# Patient Record
Sex: Male | Born: 2000 | Race: White | Hispanic: No | Marital: Single | Smoking: Never smoker
Health system: Southern US, Community
[De-identification: ages and names within clinical notes are randomized; demographics above are authoritative.]

## PROBLEM LIST (undated history)

## (undated) DIAGNOSIS — F909 Attention-deficit hyperactivity disorder, unspecified type: Secondary | ICD-10-CM

## (undated) HISTORY — PX: NO PAST SURGERIES: SHX2092

---
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
## 2020-06-21 ENCOUNTER — Other Ambulatory Visit: Payer: Self-pay

## 2020-06-21 ENCOUNTER — Encounter: Payer: Self-pay | Admitting: Emergency Medicine

## 2020-06-21 ENCOUNTER — Ambulatory Visit
Admission: EM | Admit: 2020-06-21 | Discharge: 2020-06-21 | Disposition: A | Discharge status: Home / Self Care | Payer: Managed Care, Other (non HMO) | Attending: Emergency Medicine | Admitting: Emergency Medicine

## 2020-06-21 DIAGNOSIS — Z20822 Contact with and (suspected) exposure to covid-19: Secondary | ICD-10-CM | POA: Insufficient documentation

## 2020-06-21 DIAGNOSIS — R059 Cough, unspecified: Secondary | ICD-10-CM | POA: Insufficient documentation

## 2020-06-21 DIAGNOSIS — R112 Nausea with vomiting, unspecified: Secondary | ICD-10-CM | POA: Diagnosis not present

## 2020-06-21 DIAGNOSIS — R197 Diarrhea, unspecified: Secondary | ICD-10-CM | POA: Insufficient documentation

## 2020-06-21 DIAGNOSIS — K30 Functional dyspepsia: Secondary | ICD-10-CM | POA: Diagnosis not present

## 2020-06-21 LAB — RESP PANEL BY RT-PCR (FLU A&B, COVID) ARPGX2
Influenza A by PCR: NEGATIVE
Influenza B by PCR: NEGATIVE
SARS Coronavirus 2 by RT PCR: NEGATIVE

## 2020-06-21 MED ORDER — ONDANSETRON 4 MG PO TBDP
4.0000 mg | ORAL_TABLET | Freq: Once | ORAL | Status: AC
  Administered 2020-06-21: 4 mg via ORAL

## 2020-06-21 MED ORDER — ONDANSETRON 4 MG PO TBDP: 4.0000 mg | ORAL_TABLET | Freq: Three times a day (TID) | ORAL | 0 refills | Status: DC | PRN

## 2020-06-21 NOTE — ED Triage Notes (Signed)
Pt c/o diarrhea, vomiting and cough. Started about 5 days ago. Denies fever.

## 2020-06-21 NOTE — ED Provider Notes (Signed)
[facility]    CSN: [id number] Arrival date & time: 06/21/20  7517      History   Chief Complaint Chief Complaint  Patient presents with  . Emesis  . Diarrhea    HPI Mike Young is a 19 y.o. male.   Mike Young presents with complaints of symptoms which started 12/8. Stomach upset followed by diarrhea. Has had vomiting and diarrhea since. He feels he has somewhat improved. Yesterday vomited once, and had 2-3 loose stools. Has not had either yet today. Still with mild nausea. Non bloody non bilious emesis and stool. Some dry cough. No sore throat. No fevers. Normal urination. Still taking in food and liquids. No known ill contacts. No previous abdominal surgeries. Has been vaccinated for covid-19. Has been taking mucinex for cough which has helped some.    ROS per HPI, negative if not otherwise mentioned.      Past Medical History:  Diagnosis Date  . ADHD (attention deficit hyperactivity disorder)     There are no problems to display for this patient.   Past Surgical History:  Procedure Laterality Date  . NO PAST SURGERIES         Home Medications    Prior to Admission medications   Medication Sig Start Date End Date Taking? Authorizing Provider  amphetamine-dextroamphetamine (ADDERALL XR) 25 MG 24 hr capsule Take by mouth. 07/01/19  Yes [provider]  ibuprofen (ADVIL,MOTRIN) 600 MG tablet Take 1 tablet (600 mg total) by mouth every 6 (six) hours as needed for moderate pain. 12/12/14  Yes Evon Slack, PA-C  ondansetron (ZOFRAN-ODT) 4 MG disintegrating tablet Take 1 tablet (4 mg total) by mouth every 8 (eight) hours as needed for nausea or vomiting. 06/21/20   Georgetta Haber, NP    Family History Family History  Problem Relation Age of Onset  . Healthy Mother   . Healthy Father     Social History Social History   Tobacco Use  . Smoking status: Never Smoker  . Smokeless tobacco: Never Used  Vaping Use  . Vaping Use:  Never used  Substance Use Topics  . Alcohol use: No  . Drug use: No     Allergies   Patient has no known allergies.   Review of Systems Review of Systems   Physical Exam Triage Vital Signs ED Triage Vitals  Enc Vitals Group     BP 06/21/20 1000 (!) 128/93     Pulse Rate 06/21/20 1000 72     Resp 06/21/20 1000 18     Temp 06/21/20 1000 98 F (36.7 C)     Temp Source 06/21/20 1000 Oral     SpO2 06/21/20 1000 100 %     Weight 06/21/20 0957 250 lb (113.4 kg)     Height 06/21/20 0957 6' (1.829 m)     Head Circumference --      Peak Flow --      Pain Score 06/21/20 0957 0     Pain Loc --      Pain Edu? --      Excl. in GC? --    No data found.  Updated Vital Signs BP (!) 128/93 (BP Location: Left Arm)   Pulse 72   Temp 98 F (36.7 C) (Oral)   Resp 18   Ht 6' (1.829 m)   Wt 250 lb (113.4 kg)   SpO2 100%   BMI 33.91 kg/m   Visual Acuity Right Eye Distance:   Left Eye  Distance:   Bilateral Distance:    Right Eye Near:   Left Eye Near:    Bilateral Near:     Physical Exam Constitutional:      Appearance: He is well-developed.  Cardiovascular:     Rate and Rhythm: Normal rate.  Pulmonary:     Effort: Pulmonary effort is normal.  Abdominal:     Palpations: Abdomen is soft.     Tenderness: There is no abdominal tenderness. There is no right CVA tenderness or left CVA tenderness.  Skin:    General: Skin is warm and dry.  Neurological:     Mental Status: He is alert and oriented to person, place, and time.      UC Treatments / Results  Labs (all labs ordered are listed, but only abnormal results are displayed) Labs Reviewed  RESP PANEL BY RT-PCR (FLU A&B, COVID) [id number]    EKG   Radiology No results found.  Procedures Procedures (including critical care time)  Medications Ordered in UC Medications  ondansetron (ZOFRAN-ODT) disintegrating tablet 4 mg (has no administration in time range)    Initial Impression / Assessment and Plan / UC  Course  I have reviewed the triage vital signs and the nursing notes.  Pertinent labs & imaging results that were available during my care of the patient were reviewed by me and considered in my medical decision making (see chart for details).     Non toxic. Benign physical exam.  History and physical consistent with viral illness.  zofran provided. Supportive cares recommended. Covid testing pending and isolation instructions provided.  Return precautions provided. Patient verbalized understanding and agreeable to plan.   Final Clinical Impressions(s) / UC Diagnoses   Final diagnoses:  Nausea vomiting and diarrhea     Discharge Instructions     Small frequent sips of fluids- Pedialyte, Gatorade, water, broth- to maintain hydration.  Bland diet as tolerated.  Zofran every 8 hours as needed for nausea or vomiting.  Over the counter medications as needed for symptoms.  Self isolate until covid results are back and negative.  Will notify you by phone of any positive findings. Your negative results will be sent through your MyChart.     If symptoms worsen or do not improve in the next week to return to be seen or to follow up with your PCP.      ED Prescriptions    Medication Sig Dispense Auth. Provider   ondansetron (ZOFRAN-ODT) 4 MG disintegrating tablet Take 1 tablet (4 mg total) by mouth every 8 (eight) hours as needed for nausea or vomiting. 12 tablet Georgetta Haber, NP     PDMP not reviewed this encounter.   Georgetta Haber, NP 06/21/20 1022

## 2020-06-21 NOTE — Discharge Instructions (Addendum)
Small frequent sips of fluids- Pedialyte, Gatorade, water, broth- to maintain hydration.  Bland diet as tolerated.  Zofran every 8 hours as needed for nausea or vomiting.  Over the counter medications as needed for symptoms.  Self isolate until covid results are back and negative.  Will notify you by phone of any positive findings. Your negative results will be sent through your MyChart.     If symptoms worsen or do not improve in the next week to return to be seen or to follow up with your PCP.

## 2021-01-07 ENCOUNTER — Other Ambulatory Visit: Payer: Self-pay | Admitting: Orthopedic Surgery

## 2021-01-07 DIAGNOSIS — S39012A Strain of muscle, fascia and tendon of lower back, initial encounter: Secondary | ICD-10-CM

## 2021-01-07 DIAGNOSIS — M5442 Lumbago with sciatica, left side: Secondary | ICD-10-CM

## 2021-01-20 ENCOUNTER — Inpatient Hospital Stay: Admission: RE | Admit: 2021-01-20 | Payer: Managed Care, Other (non HMO) | Source: Ambulatory Visit

## 2021-01-20 ENCOUNTER — Other Ambulatory Visit: Payer: Self-pay

## 2021-01-20 ENCOUNTER — Ambulatory Visit
Admission: RE | Admit: 2021-01-20 | Discharge: 2021-01-20 | Disposition: A | Discharge status: Home / Self Care | Payer: Managed Care, Other (non HMO) | Source: Ambulatory Visit | Attending: Orthopedic Surgery | Admitting: Orthopedic Surgery

## 2021-01-20 DIAGNOSIS — G8929 Other chronic pain: Secondary | ICD-10-CM

## 2021-01-20 DIAGNOSIS — S39012A Strain of muscle, fascia and tendon of lower back, initial encounter: Secondary | ICD-10-CM

## 2021-07-01 ENCOUNTER — Other Ambulatory Visit: Payer: Self-pay | Admitting: Physical Medicine & Rehabilitation

## 2021-07-01 DIAGNOSIS — M546 Pain in thoracic spine: Secondary | ICD-10-CM

## 2021-07-01 DIAGNOSIS — G8929 Other chronic pain: Secondary | ICD-10-CM

## 2021-07-14 ENCOUNTER — Other Ambulatory Visit: Payer: Managed Care, Other (non HMO)

## 2021-07-19 ENCOUNTER — Ambulatory Visit
Admission: RE | Admit: 2021-07-19 | Discharge: 2021-07-19 | Disposition: A | Discharge status: Home / Self Care | Payer: BC Managed Care – PPO | Source: Ambulatory Visit | Attending: Physical Medicine & Rehabilitation | Admitting: Physical Medicine & Rehabilitation

## 2021-07-19 DIAGNOSIS — G8929 Other chronic pain: Secondary | ICD-10-CM | POA: Diagnosis present

## 2021-07-19 DIAGNOSIS — M546 Pain in thoracic spine: Secondary | ICD-10-CM | POA: Insufficient documentation

## 2021-10-15 ENCOUNTER — Other Ambulatory Visit: Payer: Self-pay | Admitting: Orthopedic Surgery

## 2021-10-15 DIAGNOSIS — S134XXD Sprain of ligaments of cervical spine, subsequent encounter: Secondary | ICD-10-CM

## 2021-10-27 ENCOUNTER — Other Ambulatory Visit: Payer: Self-pay | Admitting: Orthopedic Surgery

## 2021-10-27 DIAGNOSIS — S134XXD Sprain of ligaments of cervical spine, subsequent encounter: Secondary | ICD-10-CM

## 2021-10-27 DIAGNOSIS — M5412 Radiculopathy, cervical region: Secondary | ICD-10-CM

## 2021-10-27 DIAGNOSIS — M542 Cervicalgia: Secondary | ICD-10-CM

## 2021-11-03 ENCOUNTER — Ambulatory Visit
Admission: RE | Admit: 2021-11-03 | Discharge: 2021-11-03 | Disposition: A | Discharge status: Home / Self Care | Payer: BC Managed Care – PPO | Source: Ambulatory Visit | Attending: Orthopedic Surgery | Admitting: Orthopedic Surgery

## 2021-11-03 DIAGNOSIS — M542 Cervicalgia: Secondary | ICD-10-CM

## 2021-11-03 DIAGNOSIS — M5412 Radiculopathy, cervical region: Secondary | ICD-10-CM

## 2021-11-03 DIAGNOSIS — S134XXD Sprain of ligaments of cervical spine, subsequent encounter: Secondary | ICD-10-CM

## 2022-02-03 ENCOUNTER — Emergency Department
Admission: EM | Admit: 2022-02-03 | Discharge: 2022-02-03 | Disposition: A | Discharge status: Home / Self Care | Payer: BC Managed Care – PPO | Attending: Emergency Medicine | Admitting: Emergency Medicine

## 2022-02-03 ENCOUNTER — Other Ambulatory Visit: Payer: Self-pay

## 2022-02-03 ENCOUNTER — Emergency Department: Payer: BC Managed Care – PPO

## 2022-02-03 DIAGNOSIS — S0990XA Unspecified injury of head, initial encounter: Secondary | ICD-10-CM | POA: Diagnosis present

## 2022-02-03 DIAGNOSIS — Y92410 Unspecified street and highway as the place of occurrence of the external cause: Secondary | ICD-10-CM | POA: Insufficient documentation

## 2022-02-03 DIAGNOSIS — S060X0A Concussion without loss of consciousness, initial encounter: Secondary | ICD-10-CM | POA: Diagnosis not present

## 2022-02-03 DIAGNOSIS — M791 Myalgia, unspecified site: Secondary | ICD-10-CM | POA: Insufficient documentation

## 2022-02-03 MED ORDER — ONDANSETRON 4 MG PO TBDP: 4.0000 mg | ORAL_TABLET | Freq: Three times a day (TID) | ORAL | 0 refills | Status: DC | PRN

## 2022-02-03 MED ORDER — ACETAMINOPHEN 500 MG PO TABS
1000.0000 mg | ORAL_TABLET | Freq: Once | ORAL | Status: AC
  Administered 2022-02-03: 1000 mg via ORAL
  Filled 2022-02-03: qty 2

## 2022-02-03 MED ORDER — MELOXICAM 15 MG PO TABS: 15.0000 mg | ORAL_TABLET | Freq: Every day | ORAL | 11 refills | Status: DC

## 2022-02-03 MED ORDER — NAPROXEN 500 MG PO TABS
500.0000 mg | ORAL_TABLET | Freq: Once | ORAL | Status: AC
  Administered 2022-02-03: 500 mg via ORAL
  Filled 2022-02-03: qty 1

## 2022-02-03 MED ORDER — ONDANSETRON 4 MG PO TBDP
4.0000 mg | ORAL_TABLET | Freq: Once | ORAL | Status: AC
  Administered 2022-02-03: 4 mg via ORAL
  Filled 2022-02-03: qty 1
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
## 2022-04-07 ENCOUNTER — Encounter: Payer: Self-pay | Admitting: Emergency Medicine

## 2022-04-07 ENCOUNTER — Ambulatory Visit
Admission: EM | Admit: 2022-04-07 | Discharge: 2022-04-07 | Disposition: A | Discharge status: Home / Self Care | Payer: BC Managed Care – PPO | Attending: Family Medicine | Admitting: Family Medicine

## 2022-04-07 ENCOUNTER — Ambulatory Visit: Admit: 2022-04-07 | Payer: Self-pay

## 2022-04-07 DIAGNOSIS — K529 Noninfective gastroenteritis and colitis, unspecified: Secondary | ICD-10-CM | POA: Diagnosis not present

## 2022-04-07 DIAGNOSIS — Z20822 Contact with and (suspected) exposure to covid-19: Secondary | ICD-10-CM | POA: Insufficient documentation

## 2022-04-07 DIAGNOSIS — R0981 Nasal congestion: Secondary | ICD-10-CM | POA: Diagnosis not present

## 2022-04-07 LAB — GROUP A STREP BY PCR: Group A Strep by PCR: NOT DETECTED

## 2022-04-07 LAB — SARS CORONAVIRUS 2 BY RT PCR: SARS Coronavirus 2 by RT PCR: NEGATIVE

## 2022-04-07 MED ORDER — DICYCLOMINE HCL 20 MG PO TABS: 20.0000 mg | ORAL_TABLET | Freq: Two times a day (BID) | ORAL | 0 refills | Status: DC

## 2022-04-07 MED ORDER — ONDANSETRON 8 MG PO TBDP: 8.0000 mg | ORAL_TABLET | Freq: Three times a day (TID) | ORAL | 0 refills | Status: DC | PRN

## 2022-04-07 MED ORDER — IPRATROPIUM BROMIDE 0.06 % NA SOLN: 2.0000 | Freq: Four times a day (QID) | NASAL | 12 refills | Status: AC

## 2022-04-07 NOTE — ED Triage Notes (Signed)
Pt presents with stomach pain, vomiting/nausea, HA x 4 days. He has a ST that started this morning. He reports vomiting 3xs in the past 4 days.

## 2022-04-07 NOTE — Discharge Instructions (Signed)
Your testing today was negative for both strep and COVID.  I do believe that you have a viral illness which is causing both your respiratory and your GI symptoms.  Take the Zofran every 8 hours as needed for nausea and vomiting.  They are an oral disintegrating tablet and you can place them on her under your tongue and then will be absorbed.  Use the Bentyl (dicyclomine) every 6 hours as needed for abdominal cramping.  Follow a clear liquid diet for the next 6 to 12 hours.  Clear liquids consist of broth, ginger ale, water, Pedialyte, and Jell-O.  After 6 to 12 hours, if you are tolerating clear liquids, you can advance to bland foods such as bananas, rice, applesauce, and toast.  If you tolerate bland foods you can continue to advance your diet as you see fit.  Use the Atrovent nasal spray, 2 squirts up each nostril every 6 hours, as needed for nasal congestion.  If you develop a fever over 100.5, increased abdominal pain, bloody vomit, or bloody stool return for reevaluation or go to the ER.

## 2022-04-07 NOTE — ED Provider Notes (Signed)
[facility]    CSN: [id number] Arrival date & time: 04/07/22  1249      History   Chief Complaint Chief Complaint  Patient presents with   Emesis   Headache   Abdominal Pain    HPI Mike Young is a 21 y.o. male.   HPI  21 year old male here for evaluation of respiratory and GI symptoms.  Patient reports that 4 days ago he developed a stomachache, nausea, vomiting, diarrhea, and a headache.  This morning he started to develop a sore throat.  He reports that on the first day of symptoms and yesterday he had a subjective fever but did not measure his temperature.  He has had some nasal congestion as well.  He states that he constantly feels nauseous and he has had a few intermittent spells of vomiting.  He states that he has approximately 4 episodes of diarrhea daily.  He is eating and drinking.  He denies any recent travel, eating anything that was questionable, or sick contacts.  Past Medical History:  Diagnosis Date   ADHD (attention deficit hyperactivity disorder)     There are no problems to display for this patient.   Past Surgical History:  Procedure Laterality Date   NO PAST SURGERIES         Home Medications    Prior to Admission medications   Medication Sig Start Date End Date Taking? Authorizing Provider  amphetamine-dextroamphetamine (ADDERALL XR) 25 MG 24 hr capsule Take by mouth. 07/01/19  Yes [provider]  dicyclomine (BENTYL) 20 MG tablet Take 1 tablet (20 mg total) by mouth 2 (two) times daily. 04/07/22  Yes Margarette Canada, NP  escitalopram (LEXAPRO) 20 MG tablet Take by mouth. 09/02/20  Yes [provider]  gabapentin (NEURONTIN) 300 MG capsule Take 300 mg by mouth 3 (three) times daily. 03/12/22  Yes [provider]  ipratropium (ATROVENT) 0.06 % nasal spray Place 2 sprays into both nostrils 4 (four) times daily. 04/07/22  Yes Margarette Canada, NP  ondansetron (ZOFRAN-ODT) 8 MG disintegrating tablet Take 1 tablet (8  mg total) by mouth every 8 (eight) hours as needed for nausea or vomiting. 04/07/22  Yes Margarette Canada, NP  ibuprofen (ADVIL,MOTRIN) 600 MG tablet Take 1 tablet (600 mg total) by mouth every 6 (six) hours as needed for moderate pain. 12/12/14   Duanne Guess, PA-C  meloxicam (MOBIC) 15 MG tablet Take 1 tablet (15 mg total) by mouth daily. 02/03/22 02/03/23  Teodoro Spray, PA  meloxicam (MOBIC) 7.5 MG tablet Take 7.5 mg by mouth daily. 02/21/22   [provider]    Family History Family History  Problem Relation Age of Onset   Healthy Mother    Healthy Father     Social History Social History   Tobacco Use   Smoking status: Never   Smokeless tobacco: Never  Vaping Use   Vaping Use: Never used  Substance Use Topics   Alcohol use: No   Drug use: No     Allergies   Patient has no known allergies.   Review of Systems Review of Systems  Constitutional:  Positive for fever.  HENT:  Positive for congestion and sore throat. Negative for ear pain and rhinorrhea.   Respiratory:  Negative for cough, shortness of breath and wheezing.   Gastrointestinal:  Positive for abdominal pain, diarrhea, nausea and vomiting. Negative for blood in stool.  Skin:  Negative for rash.  Hematological: Negative.   Psychiatric/Behavioral: Negative.  Physical Exam Triage Vital Signs ED Triage Vitals  Enc Vitals Group     BP 04/07/22 1319 109/76     Pulse Rate 04/07/22 1319 86     Resp 04/07/22 1319 16     Temp 04/07/22 1319 98.1 F (36.7 C)     Temp Source 04/07/22 1319 Oral     SpO2 04/07/22 1319 96 %     Weight --      Height --      Head Circumference --      Peak Flow --      Pain Score 04/07/22 1315 4     Pain Loc --      Pain Edu? --      Excl. in [place]? --    No data found.  Updated Vital Signs BP 109/76 (BP Location: Left Arm)   Pulse 86   Temp 98.1 F (36.7 C) (Oral)   Resp 16   SpO2 96%   Visual Acuity Right Eye Distance:   Left Eye Distance:    Bilateral Distance:    Right Eye Near:   Left Eye Near:    Bilateral Near:     Physical Exam Vitals and nursing note reviewed.  Constitutional:      Appearance: Normal appearance. He is not ill-appearing.  HENT:     Head: Normocephalic and atraumatic.     Right Ear: Tympanic membrane, ear canal and external ear normal. There is no impacted cerumen.     Left Ear: Tympanic membrane, ear canal and external ear normal. There is no impacted cerumen.     Nose: Congestion and rhinorrhea present.     Mouth/Throat:     Mouth: Mucous membranes are moist.     Pharynx: Oropharynx is clear. Posterior oropharyngeal erythema present. No oropharyngeal exudate.  Cardiovascular:     Rate and Rhythm: Normal rate and regular rhythm.     Pulses: Normal pulses.     Heart sounds: Normal heart sounds. No murmur heard.    No friction rub. No gallop.  Pulmonary:     Effort: Pulmonary effort is normal.     Breath sounds: Normal breath sounds. No wheezing, rhonchi or rales.  Abdominal:     General: Abdomen is flat.     Tenderness: There is no abdominal tenderness. There is no guarding or rebound.  Musculoskeletal:     Cervical back: Normal range of motion and neck supple.  Lymphadenopathy:     Cervical: No cervical adenopathy.  Skin:    General: Skin is warm and dry.     Capillary Refill: Capillary refill takes less than 2 seconds.     Findings: No erythema or rash.  Neurological:     General: No focal deficit present.     Mental Status: He is alert and oriented to person, place, and time.  Psychiatric:        Mood and Affect: Mood normal.        Behavior: Behavior normal.        Thought Content: Thought content normal.        Judgment: Judgment normal.      UC Treatments / Results  Labs (all labs ordered are listed, but only abnormal results are displayed) Labs Reviewed  GROUP A STREP BY PCR  SARS CORONAVIRUS 2 BY RT PCR    EKG   Radiology No results  found.  Procedures Procedures (including critical care time)  Medications Ordered in UC Medications - No data to display  Initial  Impression / Assessment and Plan / UC Course  I have reviewed the triage vital signs and the nursing notes.  Pertinent labs & imaging results that were available during my care of the patient were reviewed by me and considered in my medical decision making (see chart for details).   Patient is a nontoxic-appearing 21 year old male here for evaluation of upper respiratory and GI symptoms as outlined in HPI above.  On exam patient has mild erythema and edema to his nasal mucosa with scant clear rhinorrhea.  Oropharyngeal exam reveals mild posterior oropharyngeal erythema without injection.  No exudate noted.  No anterior cervical lymphadenopathy on exam.  Cardiopulmonary exam reveals clear lung sounds in all fields.  Abdomen is soft and nontender.  With patient's picture of respiratory and GI symptoms I am concerned that he has either COVID or influenza.  Also concern for strep given the recent development of a sore throat.  I will order a COVID PCR and strep PCR.  I am not going to test the patient for influenza as he is outside the therapeutic window for treatment.  COVID PCR is negative.  Strep PCR is negative.  I suspect that patient's symptoms are coming from a viral illness and I will discharge him home with some Edgemon is bright help with the nasal congestion, Zofran help with nausea, and a list of food choices to help with the diarrhea.  Any new or worsening symptoms he should return for reevaluation, see his PCP, or present to the ER.   Final Clinical Impressions(s) / UC Diagnoses   Final diagnoses:  Gastroenteritis  Nasal congestion     Discharge Instructions      Your testing today was negative for both strep and COVID.  I do believe that you have a viral illness which is causing both your respiratory and your GI symptoms.  Take the Zofran  every 8 hours as needed for nausea and vomiting.  They are an oral disintegrating tablet and you can place them on her under your tongue and then will be absorbed.  Use the Bentyl (dicyclomine) every 6 hours as needed for abdominal cramping.  Follow a clear liquid diet for the next 6 to 12 hours.  Clear liquids consist of broth, ginger ale, water, Pedialyte, and Jell-O.  After 6 to 12 hours, if you are tolerating clear liquids, you can advance to bland foods such as bananas, rice, applesauce, and toast.  If you tolerate bland foods you can continue to advance your diet as you see fit.  Use the Atrovent nasal spray, 2 squirts up each nostril every 6 hours, as needed for nasal congestion.  If you develop a fever over 100.5, increased abdominal pain, bloody vomit, or bloody stool return for reevaluation or go to the ER.      ED Prescriptions     Medication Sig Dispense Auth. Provider   ipratropium (ATROVENT) 0.06 % nasal spray Place 2 sprays into both nostrils 4 (four) times daily. 15 mL Margarette Canada, NP   ondansetron (ZOFRAN-ODT) 8 MG disintegrating tablet Take 1 tablet (8 mg total) by mouth every 8 (eight) hours as needed for nausea or vomiting. 20 tablet Margarette Canada, NP   dicyclomine (BENTYL) 20 MG tablet Take 1 tablet (20 mg total) by mouth 2 (two) times daily. 20 tablet Margarette Canada, NP      PDMP not reviewed this encounter.   Margarette Canada, NP 04/07/22 1410

## 2022-05-05 ENCOUNTER — Other Ambulatory Visit: Payer: Self-pay | Admitting: Anesthesiology

## 2022-05-05 ENCOUNTER — Encounter: Payer: Self-pay | Admitting: Anesthesiology

## 2022-05-05 DIAGNOSIS — M542 Cervicalgia: Secondary | ICD-10-CM

## 2022-05-05 DIAGNOSIS — M5114 Intervertebral disc disorders with radiculopathy, thoracic region: Secondary | ICD-10-CM

## 2022-05-09 ENCOUNTER — Ambulatory Visit: Admission: RE | Admit: 2022-05-09 | Payer: BC Managed Care – PPO | Source: Ambulatory Visit

## 2022-05-10 ENCOUNTER — Ambulatory Visit
Admission: RE | Admit: 2022-05-10 | Discharge: 2022-05-10 | Disposition: A | Discharge status: Home / Self Care | Payer: BC Managed Care – PPO | Source: Ambulatory Visit | Attending: Anesthesiology | Admitting: Anesthesiology

## 2022-05-10 DIAGNOSIS — M5114 Intervertebral disc disorders with radiculopathy, thoracic region: Secondary | ICD-10-CM | POA: Diagnosis present

## 2022-05-10 DIAGNOSIS — M542 Cervicalgia: Secondary | ICD-10-CM

## 2022-09-21 ENCOUNTER — Ambulatory Visit
Admission: RE | Admit: 2022-09-21 | Discharge: 2022-09-21 | Disposition: A | Discharge status: Home / Self Care | Payer: Self-pay | Source: Ambulatory Visit | Attending: Neurosurgery | Admitting: Neurosurgery

## 2022-09-21 ENCOUNTER — Other Ambulatory Visit: Payer: Self-pay

## 2022-09-21 DIAGNOSIS — Z049 Encounter for examination and observation for unspecified reason: Secondary | ICD-10-CM
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
## 2022-10-06 ENCOUNTER — Ambulatory Visit (INDEPENDENT_AMBULATORY_CARE_PROVIDER_SITE_OTHER): Payer: BC Managed Care – PPO | Admitting: Neurosurgery

## 2022-10-06 ENCOUNTER — Encounter: Payer: Self-pay | Admitting: Neurosurgery

## 2022-10-06 VITALS — BP 118/76 | HR 67 | Ht 73.0 in | Wt 270.0 lb

## 2022-10-06 DIAGNOSIS — G8929 Other chronic pain: Secondary | ICD-10-CM

## 2022-10-06 DIAGNOSIS — G894 Chronic pain syndrome: Secondary | ICD-10-CM

## 2022-10-06 DIAGNOSIS — M546 Pain in thoracic spine: Secondary | ICD-10-CM | POA: Diagnosis not present
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
## 2023-02-28 DIAGNOSIS — G894 Chronic pain syndrome: Secondary | ICD-10-CM | POA: Insufficient documentation

## 2023-03-10 ENCOUNTER — Ambulatory Visit
Admission: RE | Admit: 2023-03-10 | Discharge: 2023-03-10 | Disposition: A | Discharge status: Home / Self Care | Payer: BC Managed Care – PPO | Source: Ambulatory Visit

## 2023-03-10 VITALS — BP 123/69 | HR 100 | Temp 97.8°F | Resp 97 | Wt 280.0 lb

## 2023-03-10 DIAGNOSIS — J069 Acute upper respiratory infection, unspecified: Secondary | ICD-10-CM

## 2023-03-10 DIAGNOSIS — R509 Fever, unspecified: Secondary | ICD-10-CM | POA: Diagnosis not present

## 2023-03-10 LAB — SARS CORONAVIRUS 2 BY RT PCR: SARS Coronavirus 2 by RT PCR: NEGATIVE

## 2023-03-10 MED ORDER — PSEUDOEPHEDRINE HCL ER 120 MG PO TB12: 120.0000 mg | ORAL_TABLET | Freq: Two times a day (BID) | ORAL | 0 refills | Status: AC

## 2023-03-10 MED ORDER — BENZONATATE 200 MG PO CAPS: 200.0000 mg | ORAL_CAPSULE | Freq: Three times a day (TID) | ORAL | 0 refills | Status: AC | PRN

## 2023-03-10 NOTE — ED Provider Notes (Signed)
[facility]    CSN: [id number] Arrival date & time: 03/10/23  1328      History   Chief Complaint Chief Complaint  Patient presents with   Sore Throat    Congestion Nose Bleeds HeadacheChills - Entered by patient   Fever   Headache    HPI Mike Young is a 22 y.o. male who presents with onset of nose congestion and HA  Monday evening. Then the next day( 4 days ago)woke up with congestion, ST dry mouth, HA and subjective fever. Denies GI symptoms. His appetite has been poor and is getting one today.     Past Medical History:  Diagnosis Date   ADHD (attention deficit hyperactivity disorder)     There are no problems to display for this patient.   Past Surgical History:  Procedure Laterality Date   NO PAST SURGERIES         Home Medications    Prior to Admission medications   Medication Sig Start Date End Date Taking? Authorizing Provider  benzonatate (TESSALON) 200 MG capsule Take 1 capsule (200 mg total) by mouth 3 (three) times daily as needed for cough. 03/10/23  Yes Rodriguez-Southworth, Nettie Elm, PA-C  escitalopram (LEXAPRO) 20 MG tablet Take by mouth. 09/02/20  Yes [provider]  gabapentin (NEURONTIN) 300 MG capsule Take 300 mg by mouth 3 (three) times daily. 03/12/22  Yes [provider]  pseudoephedrine (SUDAFED) 120 MG 12 hr tablet Take 1 tablet (120 mg total) by mouth 2 (two) times daily. 03/10/23  Yes Rodriguez-Southworth, Nettie Elm, PA-C  amphetamine-dextroamphetamine (ADDERALL XR) 25 MG 24 hr capsule Take by mouth. 07/01/19   [provider]  ibuprofen (ADVIL,MOTRIN) 600 MG tablet Take 1 tablet (600 mg total) by mouth every 6 (six) hours as needed for moderate pain. 12/12/14   Evon Slack, PA-C  ipratropium (ATROVENT) 0.06 % nasal spray Place 2 sprays into both nostrils 4 (four) times daily. 04/07/22   Becky Augusta, NP  metaxalone (SKELAXIN) 800 MG tablet Take 800 mg by mouth 2 (two) times daily. 1/2 TABLET TWICE A DAY  TOTAL 800 MG    [provider]    Family History Family History  Problem Relation Age of Onset   Healthy Mother    Healthy Father     Social History Social History   Tobacco Use   Smoking status: Never   Smokeless tobacco: Never  Vaping Use   Vaping status: Never Used  Substance Use Topics   Alcohol use: No   Drug use: No     Allergies   Patient has no known allergies.   Review of Systems Review of Systems As noted in HPI  Physical Exam Triage Vital Signs ED Triage Vitals  Encounter Vitals Group     BP 03/10/23 1337 123/69     Systolic BP Percentile --      Diastolic BP Percentile --      Pulse Rate 03/10/23 1337 100     Resp 03/10/23 1337 (!) 97     Temp 03/10/23 1337 97.8 F (36.6 C)     Temp Source 03/10/23 1337 Oral     SpO2 03/10/23 1337 97 %     Weight 03/10/23 1335 280 lb (127 kg)     Height --      Head Circumference --      Peak Flow --      Pain Score 03/10/23 1334 7     Pain Loc --  Pain Education --      Exclude from Growth Chart --    No data found.  Updated Vital Signs BP 123/69 (BP Location: Right Arm)   Pulse 100   Temp 97.8 F (36.6 C) (Oral)   Resp (!) 97   Wt 280 lb (127 kg)   SpO2 97%   BMI 36.94 kg/m   Visual Acuity Right Eye Distance:   Left Eye Distance:   Bilateral Distance:    Right Eye Near:   Left Eye Near:    Bilateral Near:     Physical Exam  Physical Exam Vitals signs and nursing note reviewed.  Constitutional:      General: he is not in acute distress.    Appearance: Normal appearance. He is not ill-appearing, toxic-appearing or diaphoretic.  HENT:     Head: Normocephalic.     Right Ear: Tympanic membrane, ear canal and external ear normal.     Left Ear: Tympanic membrane, ear canal and external ear normal.     Nose: Nose with mucosa congestion and skin irritation on the skin of nares    Mouth/Throat: clear    Mouth: Mucous membranes are moist.  Eyes:     General: No scleral  icterus.       Right eye: No discharge.        Left eye: No discharge.     Conjunctiva/sclera: Conjunctivae normal.  Neck:     Musculoskeletal: Neck supple. No neck rigidity.  Cardiovascular:     Rate and Rhythm: Normal rate and regular rhythm.     Heart sounds: No murmur.  Pulmonary:     Effort: Pulmonary effort is normal.     Breath sounds: Normal breath sounds.    Musculoskeletal: Normal range of motion.  Lymphadenopathy:     Cervical: No cervical adenopathy.  Skin:    General: Skin is warm and dry.     Coloration: Skin is not jaundiced.     Findings: No rash.  Neurological:     Mental Status: he is alert and oriented to person, place, and time.     Gait: Gait normal.  Psychiatric:        Mood and Affect: Mood normal.        Behavior: Behavior normal.        Thought Content: Thought content normal.        Judgment: Judgment normal.   UC Treatments / Results  Labs (all labs ordered are listed, but only abnormal results are displayed) Labs Reviewed  SARS CORONAVIRUS 2 BY RT PCR    EKG   Radiology No results found.  Procedures Procedures (including critical care time)  Medications Ordered in UC Medications - No data to display  Initial Impression / Assessment and Plan / UC Course  I have reviewed the triage vital signs and the nursing notes.  Pertinent labs  results that were available during my care of the patient were reviewed by me and considered in my medical decision making (see chart for details).  URI  I placed him on Sudafed and Tessalon as noted.    Final Clinical Impressions(s) / UC Diagnoses   Final diagnoses:  Upper respiratory tract infection, unspecified type     Discharge Instructions      Your covid test is negative     ED Prescriptions     Medication Sig Dispense Auth. Provider   pseudoephedrine (SUDAFED) 120 MG 12 hr tablet Take 1 tablet (120 mg total) by mouth 2 (two)  times daily. 30 tablet Rodriguez-Southworth, Orlanda Frankum,  PA-C   benzonatate (TESSALON) 200 MG capsule Take 1 capsule (200 mg total) by mouth 3 (three) times daily as needed for cough. 30 capsule Rodriguez-Southworth, Nettie Elm, PA-C      PDMP not reviewed this encounter.   Garey Ham, PA-C 03/10/23 1421

## 2023-03-10 NOTE — ED Triage Notes (Signed)
Sx started Monday evening, went to [place] over the weekend. Woke up Tuesday with congestion, fever, sore throat,dry mouth, headache. Nose will bleed after he blows it.

## 2023-03-10 NOTE — Discharge Instructions (Signed)
Your covid test is negative.

## 2023-04-17 ENCOUNTER — Ambulatory Visit
Admission: EM | Admit: 2023-04-17 | Discharge: 2023-04-17 | Disposition: A | Discharge status: Home / Self Care | Payer: BC Managed Care – PPO | Attending: Emergency Medicine | Admitting: Emergency Medicine

## 2023-04-17 DIAGNOSIS — L739 Follicular disorder, unspecified: Secondary | ICD-10-CM | POA: Diagnosis not present

## 2023-04-17 MED ORDER — MUPIROCIN 2 % EX OINT: 1.0000 | TOPICAL_OINTMENT | Freq: Two times a day (BID) | CUTANEOUS | 0 refills | Status: AC

## 2023-04-17 MED ORDER — DOXYCYCLINE HYCLATE 100 MG PO CAPS: 100.0000 mg | ORAL_CAPSULE | Freq: Two times a day (BID) | ORAL | 0 refills | Status: DC

## 2023-04-17 NOTE — ED Provider Notes (Signed)
[facility]    CSN: [id number] Arrival date & time: 04/17/23  1531      History   Chief Complaint Chief Complaint  Patient presents with   Abscess    HPI Mike Young is a 22 y.o. male.   22 year old male patient,Mike Young, presents to urgent care for evaluation of tender area to right lower pubic mound area since last night.  Patient has been using warm compresses, patient denies any injury or trauma.  The history is provided by the patient. No language interpreter was used.    Past Medical History:  Diagnosis Date   ADHD (attention deficit hyperactivity disorder)     Patient Active Problem List   Diagnosis Date Noted   Folliculitis 04/17/2023    Past Surgical History:  Procedure Laterality Date   NO PAST SURGERIES         Home Medications    Prior to Admission medications   Medication Sig Start Date End Date Taking? Authorizing Provider  doxycycline (VIBRAMYCIN) 100 MG capsule Take 1 capsule (100 mg total) by mouth 2 (two) times daily. 04/17/23  Yes Jayln Madeira, Para March, NP  escitalopram (LEXAPRO) 20 MG tablet Take by mouth. 09/02/20  Yes [provider]  gabapentin (NEURONTIN) 300 MG capsule Take 300 mg by mouth 3 (three) times daily. 03/12/22  Yes [provider]  mupirocin ointment (BACTROBAN) 2 % Apply 1 Application topically 2 (two) times daily for 7 days. Right wrist 04/17/23 04/24/23 Yes Ichelle Harral, Para March, NP  amphetamine-dextroamphetamine (ADDERALL XR) 25 MG 24 hr capsule Take by mouth. 07/01/19   [provider]  benzonatate (TESSALON) 200 MG capsule Take 1 capsule (200 mg total) by mouth 3 (three) times daily as needed for cough. 03/10/23   Rodriguez-Southworth, Nettie Elm, PA-C  ibuprofen (ADVIL,MOTRIN) 600 MG tablet Take 1 tablet (600 mg total) by mouth every 6 (six) hours as needed for moderate pain. 12/12/14   Evon Slack, PA-C  ipratropium (ATROVENT) 0.06 % nasal spray Place 2 sprays into both nostrils 4  (four) times daily. 04/07/22   Becky Augusta, NP  metaxalone (SKELAXIN) 800 MG tablet Take 800 mg by mouth 2 (two) times daily. 1/2 TABLET TWICE A DAY TOTAL 800 MG    [provider]  pseudoephedrine (SUDAFED) 120 MG 12 hr tablet Take 1 tablet (120 mg total) by mouth 2 (two) times daily. 03/10/23   Rodriguez-Southworth, Nettie Elm, PA-C    Family History Family History  Problem Relation Age of Onset   Healthy Mother    Healthy Father     Social History Social History   Tobacco Use   Smoking status: Never   Smokeless tobacco: Never  Vaping Use   Vaping status: Never Used  Substance Use Topics   Alcohol use: No   Drug use: No     Allergies   Patient has no known allergies.   Review of Systems Review of Systems  Skin:  Positive for color change and wound.  All other systems reviewed and are negative.    Physical Exam Triage Vital Signs ED Triage Vitals  Encounter Vitals Group     BP 04/17/23 1602 (!) 110/95     Systolic BP Percentile --      Diastolic BP Percentile --      Pulse Rate 04/17/23 1602 (!) 111     Resp 04/17/23 1602 18     Temp 04/17/23 1602 98.1 F (36.7 C)     Temp Source 04/17/23 1602 Oral     SpO2  04/17/23 1602 99 %     Weight --      Height --      Head Circumference --      Peak Flow --      Pain Score 04/17/23 1601 3     Pain Loc --      Pain Education --      Exclude from Growth Chart --    No data found.  Updated Vital Signs BP (!) 110/95 (BP Location: Left Arm)   Pulse (!) 111   Temp 98.1 F (36.7 C) (Oral)   Resp 18   SpO2 99%   Visual Acuity Right Eye Distance:   Left Eye Distance:   Bilateral Distance:    Right Eye Near:   Left Eye Near:    Bilateral Near:     Physical Exam Vitals and nursing note reviewed.  Skin:    General: Skin is warm.          Comments: Chaperone, Tiffany, present during exam  Neurological:     General: No focal deficit present.     Mental Status: He is alert and oriented to person,  place, and time.     GCS: GCS eye subscore is 4. GCS verbal subscore is 5. GCS motor subscore is 6.     Cranial Nerves: No cranial nerve deficit.     Sensory: No sensory deficit.  Psychiatric:        Attention and Perception: Attention normal.        Mood and Affect: Mood normal.        Speech: Speech normal.        Behavior: Behavior normal.      UC Treatments / Results  Labs (all labs ordered are listed, but only abnormal results are displayed) Labs Reviewed - No data to display  EKG   Radiology No results found.  Procedures Procedures (including critical care time)  Medications Ordered in UC Medications - No data to display  Initial Impression / Assessment and Plan / UC Course  I have reviewed the triage vital signs and the nursing notes.  Pertinent labs & imaging results that were available during my care of the patient were reviewed by me and considered in my medical decision making (see chart for details).     Ddx: Folliculitis, cellulitis, insect bite,cyst Final Clinical Impressions(s) / UC Diagnoses   Final diagnoses:  Folliculitis     Discharge Instructions      Do not pick or scratch or squeeze the area. Apply topical mupirocin ointment to area as prescribed, take oral antibiotic as prescribed, avoid sunlight as it may cause sensitivity. Go to ER for new or worsening issues or concerns: fever over 101, chills, nausea,vomiting, worsening redness/streaking     ED Prescriptions     Medication Sig Dispense Auth. Provider   mupirocin ointment (BACTROBAN) 2 % Apply 1 Application topically 2 (two) times daily for 7 days. Right wrist 15 g Jaquelynn Wanamaker, NP   doxycycline (VIBRAMYCIN) 100 MG capsule Take 1 capsule (100 mg total) by mouth 2 (two) times daily. 20 capsule Melessa Cowell, Para March, NP      PDMP not reviewed this encounter.   Clancy Gourd, NP 04/17/23 1756

## 2023-04-17 NOTE — Discharge Instructions (Signed)
Do not pick or scratch or squeeze the area. Apply topical mupirocin ointment to area as prescribed, take oral antibiotic as prescribed, avoid sunlight as it may cause sensitivity. Go to ER for new or worsening issues or concerns: fever over 101, chills, nausea,vomiting, worsening redness/streaking

## 2023-04-17 NOTE — ED Triage Notes (Signed)
Patient noticed an abscess on the right side of his pubis mound last night. Used warm compress to help with swelling.

## 2024-01-21 ENCOUNTER — Inpatient Hospital Stay
Admission: RE | Admit: 2024-01-21 | Discharge: 2024-01-21 | Discharge status: Home / Self Care | Payer: Self-pay | Source: Ambulatory Visit | Attending: Family Medicine

## 2024-01-21 ENCOUNTER — Ambulatory Visit: Payer: Self-pay | Admitting: Family Medicine

## 2024-01-21 ENCOUNTER — Ambulatory Visit (INDEPENDENT_AMBULATORY_CARE_PROVIDER_SITE_OTHER)

## 2024-01-21 VITALS — BP 119/77 | HR 85 | Temp 98.7°F | Resp 16 | Ht 73.0 in | Wt 281.0 lb

## 2024-01-21 DIAGNOSIS — F419 Anxiety disorder, unspecified: Secondary | ICD-10-CM | POA: Insufficient documentation

## 2024-01-21 DIAGNOSIS — J209 Acute bronchitis, unspecified: Secondary | ICD-10-CM | POA: Diagnosis not present

## 2024-01-21 DIAGNOSIS — J309 Allergic rhinitis, unspecified: Secondary | ICD-10-CM | POA: Insufficient documentation

## 2024-01-21 DIAGNOSIS — R051 Acute cough: Secondary | ICD-10-CM | POA: Diagnosis not present

## 2024-01-21 DIAGNOSIS — F909 Attention-deficit hyperactivity disorder, unspecified type: Secondary | ICD-10-CM | POA: Insufficient documentation

## 2024-01-21 MED ORDER — PROMETHAZINE-DM 6.25-15 MG/5ML PO SYRP: 5.0000 mL | ORAL_SOLUTION | Freq: Four times a day (QID) | ORAL | 0 refills | Status: AC | PRN

## 2024-01-21 MED ORDER — PREDNISONE 10 MG (21) PO TBPK: ORAL_TABLET | Freq: Every day | ORAL | 0 refills | Status: AC

## 2024-01-21 MED ORDER — AZITHROMYCIN 250 MG PO TABS: ORAL_TABLET | ORAL | 0 refills | Status: AC

## 2024-01-21 NOTE — Discharge Instructions (Signed)
 Your chest xray did not show evidence of pneumonia  though the radiologist has not yet read it. If they find something that I didn't, I will call you.    Stop by the pharmacy to pick up your prescriptions.  Follow up with your primary care provider or return to the urgent care, if not improving.

## 2024-01-21 NOTE — ED Provider Notes (Signed)
 [facility]    CSN: [id number] Arrival date & time: 01/21/24  1035      History   Chief Complaint Chief Complaint  Patient presents with   Cough    HPI Mike Young is a 23 y.o. male.   HPI  History obtained from the patient. Mike Young presents for persisternt cough.  He went to a gaming convence 2 weeks ago. He started feeling sick with cough, fever and nausea. He thought his symptoms got better and then got worse again.  His cough gets bad at night.  Endorses chest tightness. Denies sputum production and shortness of breath. Tried cough synryp, NyQuil, DayQuil and Muciniex without relief.   No history of asthma. Denies vaping and smoking.        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 Authorizing Provider  azithromycin  (ZITHROMAX  Z-PAK) 250 MG tablet Take 2 tablets on day 1 then 1 tablet daily 01/21/24  Yes Mike Mersereau, DO  predniSONE  (STERAPRED UNI-PAK 21 TAB) 10 MG (21) TBPK tablet Take by mouth daily. Take 6 tabs by mouth daily for 1, then 5 tabs for 1 day, then 4 tabs for 1 day, then 3 tabs for 1 day, then 2 tabs for 1 day, then 1 tab for 1 day. 01/21/24  Yes Mike Hasz, DO  promethazine -dextromethorphan (PROMETHAZINE -DM) 6.25-15 MG/5ML syrup Take 5 mLs by mouth 4 (four) times daily as needed. 01/21/24  Yes Mike Metts, DO  amphetamine-dextroamphetamine (ADDERALL XR) 25 MG 24 hr capsule Take by mouth. 07/01/19   [provider]  benzonatate  (TESSALON ) 200 MG capsule Take 1 capsule (200 mg total) by mouth 3 (three) times daily as needed for cough. 03/10/23   Mike Young, Sylvia, PA-C  escitalopram (LEXAPRO) 20 MG tablet Take by mouth. 09/02/20   [provider]  gabapentin (NEURONTIN) 300 MG capsule Take 300 mg by mouth 3 (three) times daily. 03/12/22   [provider]  ibuprofen  (ADVIL ,MOTRIN ) 600 MG tablet Take 1 tablet (600 mg total) by mouth every 6 (six) hours as needed for moderate pain. 12/12/14   Mike Debby BROCKS, PA-C  ipratropium (ATROVENT ) 0.06 % nasal spray Place 2 sprays into both nostrils 4 (four) times daily. 04/07/22   Mike Ditch, NP  metaxalone (SKELAXIN) 800 MG tablet Take 800 mg by mouth 2 (two) times daily. 1/2 TABLET TWICE A DAY TOTAL 800 MG    [provider]  pseudoephedrine  (SUDAFED) 120 MG 12 hr tablet Take 1 tablet (120 mg total) by mouth 2 (two) times daily. 03/10/23   Mike Young, Kyra, PA-C    Family History Family History  Problem Relation Age of Onset   Healthy Mother    Healthy Father     Social History Social History   Tobacco Use   Smoking status: Never   Smokeless tobacco: Never  Vaping Use   Vaping status: Never Used  Substance Use Topics   Alcohol use: No   Drug use: No  Allergies   Patient has no known allergies.   Review of Systems Review of Systems: negative unless otherwise stated in HPI.      Physical Exam Triage Vital Signs ED Triage Vitals  Encounter Vitals Group     BP 01/21/24 1046 119/77     Girls Systolic BP Percentile --      Girls Diastolic BP Percentile --      Boys Systolic BP Percentile --      Boys Diastolic BP Percentile --      Pulse Rate 01/21/24 1046 85     Resp 01/21/24 1046 16     Temp 01/21/24 1046 98.7 F (37.1 C)     Temp Source 01/21/24 1046 Oral     SpO2 01/21/24 1046 99 %     Weight 01/21/24 1046 281 lb (127.5 kg)     Height 01/21/24 1046 6' 1 (1.854 m)      Head Circumference --      Peak Flow --      Pain Score 01/21/24 1049 2     Pain Loc --      Pain Education --      Exclude from Growth Chart --    No data found.  Updated Vital Signs BP 119/77 (BP Location: Left Arm)   Pulse 85   Temp 98.7 F (37.1 C) (Oral)   Resp 16   Ht 6' 1 (1.854 m)   Wt 127.5 kg   SpO2 99%   BMI 37.07 kg/m   Visual Acuity Right Eye Distance:   Left Eye Distance:   Bilateral Distance:    Right Eye Near:   Left Eye Near:    Bilateral Near:     Physical Exam GEN:     alert, non-toxic appearing male in no distress    HENT:  mucus membranes moist, no nasal discharge EYES:   no scleral injection or discharge NECK:  normal ROM  RESP:  no increased work of breathing, rhonchi bilaterally, no wheezing or rales  CVS:   regular rate and rhythm Skin:   warm and dry, no rash on visible skin    UC Treatments / Results  Labs (all labs ordered are listed, but only abnormal results are displayed) Labs Reviewed - No data to display  EKG   Radiology DG Chest 2 View Result Date: 01/21/2024 EXAM: 2 VIEW(S) XRAY OF THE CHEST 01/21/2024 11:04:00 AM COMPARISON: 2 view chest x-ray 02/15/2014. CLINICAL HISTORY: Cough. Patient states horrible cough going on for 2 weeks and not improving. FINDINGS: LUNGS AND PLEURA: No focal pulmonary opacity. No pulmonary edema. No pleural effusion. No pneumothorax. HEART AND MEDIASTINUM: No acute abnormality of the cardiac and mediastinal silhouettes. BONES AND SOFT TISSUES: No acute osseous abnormality. IMPRESSION: 1. No acute process. Electronically signed by: Mike Necessary MD 01/21/2024 11:24 AM EDT RP Workstation: [id number]     Procedures Procedures (including critical care time)  Medications Ordered in UC Medications - No data to display  Initial Impression / Assessment and Plan / UC Course  I have reviewed the triage vital signs and the nursing notes.  Pertinent labs & imaging results that were available  during my care of the patient were reviewed by me and considered in my medical decision making (see chart for details).       Pt is a 23 y.o. male who presents for 2 weeks of cough that is not improving.  Mike Young is  afebrile here without recent antipyretics. Satting well on room air.  Overall pt is  non-toxic appearing, well hydrated, without respiratory distress. Pulmonary exam is remarkable for rhonchi that clear with cough.  After shared decision making, we will pursue chest x-ray.  COVID  and influenza testing deferred due to length of symptoms.   Chest xray personally reviewed by me without focal pneumonia, pleural effusion, cardiomegaly or pneumothorax. Patient aware the radiologist has not read his xray and is comfortable with the preliminary read by me. Will review radiologist read when available and call patient if a change in plan is warranted.  Pt agreeable to this plan prior to discharge.   Treat acute bronchitis with steroids and antibiotics as below. Promethazine  DM cough syrup given for cough and allow patient to rest.  Typical duration of symptoms discussed. Return and ED precautions given and patient voiced understanding.   Discussed MDM, treatment plan and plan for follow-up with patient who agrees with plan.   Radiologist impression reviewed. No change in treatment.    Final Clinical Impressions(s) / UC Diagnoses   Final diagnoses:  Acute bronchitis, unspecified organism  Acute cough     Discharge Instructions      Your chest xray did not show evidence of pneumonia though the radiologist has not yet read it. If they find something that I didn't, I will call you.     Stop by the pharmacy to pick up your prescriptions.  Follow up with your primary care provider or return to the urgent care, if not improving.       ED Prescriptions     Medication Sig Dispense Auth. Provider   predniSONE  (STERAPRED UNI-PAK 21 TAB) 10 MG (21) TBPK tablet Take by mouth daily. Take  6 tabs by mouth daily for 1, then 5 tabs for 1 day, then 4 tabs for 1 day, then 3 tabs for 1 day, then 2 tabs for 1 day, then 1 tab for 1 day. 21 tablet Callaghan Laverdure, DO   promethazine -dextromethorphan (PROMETHAZINE -DM) 6.25-15 MG/5ML syrup Take 5 mLs by mouth 4 (four) times daily as needed. 118 mL Cashlynn Yearwood, DO   azithromycin  (ZITHROMAX  Z-PAK) 250 MG tablet Take 2 tablets on day 1 then 1 tablet daily 6 tablet Aolani Piggott, DO      PDMP not reviewed this encounter.   Joelle Roswell, DO 01/24/24 [id number]

## 2024-01-21 NOTE — ED Triage Notes (Signed)
 Pt states horrible cough going on for 2 weeks and not improving - Entered by patient

## 2024-10-30 ENCOUNTER — Ambulatory Visit: Admitting: Family Medicine
